# Patient Record
Sex: Female | Born: 1995 | Race: Black or African American | Hispanic: No | Marital: Single | Smoking: Never smoker
Health system: Southern US, Community
[De-identification: ages and names within clinical notes are randomized; demographics above are authoritative.]

## PROBLEM LIST (undated history)

## (undated) DIAGNOSIS — E282 Polycystic ovarian syndrome: Secondary | ICD-10-CM

## (undated) HISTORY — PX: WISDOM TOOTH EXTRACTION: SHX21

## (undated) HISTORY — DX: Polycystic ovarian syndrome: E28.2

## (undated) HISTORY — PX: CHOLECYSTECTOMY: SHX55

## (undated) HISTORY — PX: OTHER SURGICAL HISTORY: SHX169

---
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
## 2022-10-02 ENCOUNTER — Other Ambulatory Visit: Payer: Self-pay

## 2022-10-02 ENCOUNTER — Encounter (HOSPITAL_BASED_OUTPATIENT_CLINIC_OR_DEPARTMENT_OTHER): Payer: Self-pay

## 2022-10-02 DIAGNOSIS — Z20822 Contact with and (suspected) exposure to covid-19: Secondary | ICD-10-CM | POA: Diagnosis not present

## 2022-10-02 DIAGNOSIS — J069 Acute upper respiratory infection, unspecified: Secondary | ICD-10-CM | POA: Insufficient documentation

## 2022-10-02 DIAGNOSIS — R0981 Nasal congestion: Secondary | ICD-10-CM | POA: Diagnosis present

## 2022-10-02 LAB — RESP PANEL BY RT-PCR (FLU A&B, COVID) ARPGX2
Influenza A by PCR: NEGATIVE
Influenza B by PCR: NEGATIVE
SARS Coronavirus 2 by RT PCR: NEGATIVE

## 2022-10-02 NOTE — ED Triage Notes (Deleted)
Pt ambulatory to triage with c/o sinus congestion. Pt recently seen at urgent care and told she has an URI and was prescribed Tessalon Perles. Pt concerned because when she blew her nose she had a large pink tinged snot and one large red tinged snot.

## 2022-10-02 NOTE — ED Triage Notes (Signed)
Pt ambulatory to triage with c/o sinus congestion. Pt recently seen at urgent care and told she has an URI and was prescribed Tessalon Perles. Pt concerned because when she blew her nose she had a large pink tinged snot and one large red tinged snot. 

## 2022-10-03 ENCOUNTER — Emergency Department (HOSPITAL_BASED_OUTPATIENT_CLINIC_OR_DEPARTMENT_OTHER)
Admission: EM | Admit: 2022-10-03 | Discharge: 2022-10-03 | Disposition: A | Discharge status: Home / Self Care | Payer: BC Managed Care – PPO | Attending: Emergency Medicine | Admitting: Emergency Medicine

## 2022-10-03 DIAGNOSIS — J069 Acute upper respiratory infection, unspecified: Secondary | ICD-10-CM

## 2022-10-03 NOTE — Discharge Instructions (Signed)
When you blow your nose, use multiple light blows rather than 1 hard blow.  You may use any of the over-the-counter cough and cold medications as needed, but there not very effective.  Drink plenty of fluids.  Take acetaminophen and/or ibuprofen as needed for fever or aching.

## 2022-10-03 NOTE — ED Provider Notes (Signed)
[facility] EMERGENCY DEPARTMENT Provider Note   CSN: [id number] Arrival date & time: 10/02/22  2211     History  Chief Complaint  Patient presents with   Nasal Congestion    Julie Alvarez is a 26 y.o. female.  The history is provided by the patient.  She started getting sick 5 days ago with nasal congestion, sore throat, slight cough which is nonproductive.  She went to an urgent care center 2 days ago where she was diagnosed with a viral URI and given prescription for benzonatate.  She states that benzonatate has not helped her cough.  However, she has noted that her nasal mucus has been blood-streaked.  She has run low-grade fevers but no chills.  She has had sweats, but thinks that this may have been related to her menses.  She denies any sick contacts.  Her main concern is the blood in her nasal mucus.   Home Medications Prior to Admission medications   Medication Sig Start Date End Date Taking? Authorizing Provider  benzonatate (TESSALON) 200 MG capsule Take by mouth. 10/01/22 10/08/22 Yes [provider]  Norethindrone Acetate-Ethinyl Estrad-FE (BLISOVI 24 FE) 1-20 MG-MCG(24) tablet Take 1 tablet by mouth daily.   Yes [provider]      Allergies    Patient has no known allergies.    Review of Systems   Review of Systems  All other systems reviewed and are negative.   Physical Exam Updated Vital Signs BP (!) 138/90 (BP Location: Right Arm)   Pulse 86   Temp 98.8 F (37.1 C) (Oral)   Resp 14   Ht 4\' 11"  (1.499 m)   Wt 79.4 kg   LMP 09/05/2022 (Approximate)   SpO2 99%   BMI 35.35 kg/m  Physical Exam Vitals and nursing note reviewed.  26 year old female, resting comfortably and in no acute distress. Vital signs are normal. Oxygen saturation is 99%, which is normal. Head is normocephalic and atraumatic. PERRLA, EOMI. Oropharynx is clear.  There is no sinus tenderness.  Nasal turbinates are mildly edematous, right greater  than left.  No bleeding site is identified. Neck is nontender and supple without adenopathy or JVD. Back is nontender and there is no CVA tenderness. Lungs are clear without rales, wheezes, or rhonchi. Chest is nontender. Heart has regular rate and rhythm without murmur. Abdomen is soft, flat, nontender. Extremities have no cyanosis or edema, full range of motion is present. Skin is warm and dry without rash. Neurologic: Mental status is normal, cranial nerves are intact, moves all extremities equally.  ED Results / Procedures / Treatments   Labs (all labs ordered are listed, but only abnormal results are displayed) Labs Reviewed  RESP PANEL BY RT-PCR (FLU A&B, COVID) ARPGX2   Procedures Procedures    Medications Ordered in ED Medications - No data to display  ED Course/ Medical Decision Making/ A&P                           Medical Decision Making  Respiratory tract infection which is most likely viral.  Consider influenza, COVID-19, other viral respiratory infections.  No evidence of significant nosebleed, I suspect this is from trauma from blowing her nose very hard.  I have reviewed and interpreted her laboratory test, and my interpretation is no evidence of COVID-19 or influenza.  Patient is advised of this finding.  I have reviewed her old records, and she was seen at  urgent care on 10/01/2022 with diagnosis of viral URI with cough.  I explained to the patient that there is no indication for antibiotics at this time, but she should be reevaluated if the nature of her symptoms seems to change.  I recommended she continue taking acetaminophen and ibuprofen as needed for fever or aching, use over-the-counter cough and cold medications as needed.  Final Clinical Impression(s) / ED Diagnoses Final diagnoses:  Viral URI with cough    Rx / DC Orders ED Discharge Orders     None         Delora Fuel, MD 79/89/21 [phone]

## 2023-01-07 ENCOUNTER — Encounter (HOSPITAL_BASED_OUTPATIENT_CLINIC_OR_DEPARTMENT_OTHER): Payer: Self-pay

## 2023-01-07 ENCOUNTER — Emergency Department (HOSPITAL_BASED_OUTPATIENT_CLINIC_OR_DEPARTMENT_OTHER)
Admission: EM | Admit: 2023-01-07 | Discharge: 2023-01-08 | Disposition: A | Discharge status: Home / Self Care | Payer: BC Managed Care – PPO | Attending: Emergency Medicine | Admitting: Emergency Medicine

## 2023-01-07 ENCOUNTER — Other Ambulatory Visit: Payer: Self-pay

## 2023-01-07 DIAGNOSIS — Z1152 Encounter for screening for COVID-19: Secondary | ICD-10-CM | POA: Diagnosis not present

## 2023-01-07 DIAGNOSIS — R42 Dizziness and giddiness: Secondary | ICD-10-CM | POA: Insufficient documentation

## 2023-01-07 DIAGNOSIS — R300 Dysuria: Secondary | ICD-10-CM | POA: Insufficient documentation

## 2023-01-07 DIAGNOSIS — R55 Syncope and collapse: Secondary | ICD-10-CM | POA: Diagnosis not present

## 2023-01-07 DIAGNOSIS — R350 Frequency of micturition: Secondary | ICD-10-CM | POA: Diagnosis not present

## 2023-01-07 DIAGNOSIS — R944 Abnormal results of kidney function studies: Secondary | ICD-10-CM | POA: Insufficient documentation

## 2023-01-07 DIAGNOSIS — N3000 Acute cystitis without hematuria: Secondary | ICD-10-CM

## 2023-01-07 LAB — BASIC METABOLIC PANEL
Anion gap: 9 (ref 5–15)
BUN: 12 mg/dL (ref 6–20)
CO2: 23 mmol/L (ref 22–32)
Calcium: 8.9 mg/dL (ref 8.9–10.3)
Chloride: 103 mmol/L (ref 98–111)
Creatinine, Ser: 1.05 mg/dL — ABNORMAL HIGH (ref 0.44–1.00)
GFR, Estimated: >60 mL/min (ref >60)
Glucose, Bld: 115 mg/dL — ABNORMAL HIGH (ref 70–99)
Potassium: 3.9 mmol/L (ref 3.5–5.1)
Sodium: 135 mmol/L (ref 135–145)

## 2023-01-07 LAB — CBG MONITORING, ED: Glucose-Capillary: 100 mg/dL — ABNORMAL HIGH (ref 70–99)

## 2023-01-07 LAB — URINALYSIS, ROUTINE W REFLEX MICROSCOPIC
Bilirubin Urine: NEGATIVE
Glucose, UA: NEGATIVE mg/dL
Ketones, ur: NEGATIVE mg/dL
Nitrite: NEGATIVE
Protein, ur: NEGATIVE mg/dL
Specific Gravity, Urine: 1.025 (ref 1.005–1.030)
pH: 5 (ref 5.0–8.0)

## 2023-01-07 LAB — URINALYSIS, MICROSCOPIC (REFLEX)

## 2023-01-07 LAB — RESP PANEL BY RT-PCR (RSV, FLU A&B, COVID)  RVPGX2
Influenza A by PCR: NEGATIVE
Influenza B by PCR: NEGATIVE
Resp Syncytial Virus by PCR: NEGATIVE
SARS Coronavirus 2 by RT PCR: NEGATIVE

## 2023-01-07 LAB — PREGNANCY, URINE: Preg Test, Ur: NEGATIVE

## 2023-01-07 MED ORDER — IBUPROFEN 800 MG PO TABS
800.0000 mg | ORAL_TABLET | Freq: Once | ORAL | Status: AC
  Administered 2023-01-08: 800 mg via ORAL
  Filled 2023-01-07: qty 1

## 2023-01-07 MED ORDER — CEPHALEXIN 250 MG PO CAPS
500.0000 mg | ORAL_CAPSULE | Freq: Once | ORAL | Status: AC
  Administered 2023-01-08: 500 mg via ORAL
  Filled 2023-01-07: qty 2

## 2023-01-07 NOTE — ED Triage Notes (Signed)
Pt endorses dizziness that began today. Mother reports she has been having HA's and pt endorses subjective fevers. States she has felt warm. Denies N/V/D.

## 2023-01-07 NOTE — ED Provider Notes (Incomplete)
[facility] Provider Note   CSN: [id number] Arrival date & time: 01/07/23  2157     History {Add pertinent medical, surgical, social history, OB history to HPI:1} Chief Complaint  Patient presents with   Dizziness   Headache    Julie Alvarez is a 27 y.o. female.   Dizziness Quality:  Lightheadedness Severity:  Moderate Onset quality:  Gradual Duration:  1 day Progression:  Waxing and waning Chronicity:  New Relieved by:  Nothing Worsened by:  Nothing Ineffective treatments:  None tried Associated symptoms: no tinnitus   Risk factors: no hx of vertigo   Patient with one day of lightheadedness and had a headache yesterday.  Also has had urinary frequency and dysuria.  No rashes on the skin.  No neck pain or stiffness.       Home Medications Prior to Admission medications   Medication Sig Start Date End Date Taking? Authorizing Provider  Norethindrone Acetate-Ethinyl Estrad-FE (BLISOVI 24 FE) 1-20 MG-MCG(24) tablet Take 1 tablet by mouth daily.    [provider]      Allergies    Patient has no known allergies.    Review of Systems   Review of Systems  HENT:  Negative for tinnitus.     Physical Exam Updated Vital Signs BP (!) 136/93 (BP Location: Right Arm)   Pulse 92   Temp 98.7 F (37.1 C) (Oral)   Resp 18   Ht 4\' 11"  (1.499 m)   Wt 81.6 kg   SpO2 100%   BMI 36.36 kg/m  Physical Exam  ED Results / Procedures / Treatments   Labs (all labs ordered are listed, but only abnormal results are displayed) Labs Reviewed  BASIC METABOLIC PANEL - Abnormal; Notable for the following components:      Result Value   Glucose, Bld 115 (*)    Creatinine, Ser 1.05 (*)    All other components within normal limits  URINALYSIS, ROUTINE W REFLEX MICROSCOPIC - Abnormal; Notable for the following components:   APPearance HAZY (*)    Hgb urine dipstick SMALL (*)    Leukocytes,Ua SMALL (*)    All other  components within normal limits  URINALYSIS, MICROSCOPIC (REFLEX) - Abnormal; Notable for the following components:   Bacteria, UA MANY (*)    All other components within normal limits  CBG MONITORING, ED - Abnormal; Notable for the following components:   Glucose-Capillary 100 (*)    All other components within normal limits  RESP PANEL BY RT-PCR (RSV, FLU A&B, COVID)  RVPGX2  PREGNANCY, URINE  CBC    EKG None  Radiology No results found.  Procedures Procedures  {Document cardiac monitor, telemetry assessment procedure when appropriate:1}  Medications Ordered in ED Medications  ibuprofen (ADVIL) tablet 800 mg (has no administration in time range)  cephALEXin (KEFLEX) capsule 500 mg (has no administration in time range)    ED Course/ Medical Decision Making/ A&P   {   Click here for ABCD2, HEART and other calculatorsREFRESH Note before signing :1}                          Medical Decision Making Amount and/or Complexity of Data Reviewed Labs: ordered.  Risk Prescription drug management.   ***  {Document critical care time when appropriate:1} {Document review of labs and clinical decision tools ie heart score, Chads2Vasc2 etc:1}  {Document your independent review of radiology images, and any outside records:1} {  Document your discussion with family members, caretakers, and with consultants:1} {Document social determinants of health affecting pt's care:1} {Document your decision making why or why not admission, treatments were needed:1} Final Clinical Impression(s) / ED Diagnoses Final diagnoses:  None    Rx / DC Orders ED Discharge Orders     None

## 2023-01-08 MED ORDER — CEPHALEXIN 500 MG PO CAPS: 500.0000 mg | ORAL_CAPSULE | Freq: Four times a day (QID) | ORAL | 0 refills | Status: DC

## 2023-01-08 NOTE — ED Notes (Addendum)
Pt reports HA and dizziness and fevers on/off. Labs have been sent Mother at bedside Cranberry juice given to pt

## 2024-03-07 ENCOUNTER — Emergency Department (HOSPITAL_BASED_OUTPATIENT_CLINIC_OR_DEPARTMENT_OTHER)
Admission: EM | Admit: 2024-03-07 | Discharge: 2024-03-07 | Disposition: A | Discharge status: Home / Self Care | Attending: Emergency Medicine | Admitting: Emergency Medicine

## 2024-03-07 ENCOUNTER — Encounter (HOSPITAL_BASED_OUTPATIENT_CLINIC_OR_DEPARTMENT_OTHER): Payer: Self-pay | Admitting: Emergency Medicine

## 2024-03-07 ENCOUNTER — Other Ambulatory Visit: Payer: Self-pay

## 2024-03-07 DIAGNOSIS — R21 Rash and other nonspecific skin eruption: Secondary | ICD-10-CM | POA: Insufficient documentation

## 2024-03-07 MED ORDER — DESONIDE 0.05 % EX CREA: TOPICAL_CREAM | Freq: Two times a day (BID) | CUTANEOUS | 0 refills | Status: AC

## 2024-03-07 MED ORDER — DEXAMETHASONE SODIUM PHOSPHATE 10 MG/ML IJ SOLN
10.0000 mg | Freq: Once | INTRAMUSCULAR | Status: AC
  Administered 2024-03-07: 10 mg via INTRAMUSCULAR
  Filled 2024-03-07: qty 1

## 2024-03-07 NOTE — ED Provider Notes (Signed)
 [facility] Provider Note   CSN: [id number] Arrival date & time: 03/07/24  1941     History  Chief Complaint  Patient presents with   Rash    Julie Alvarez is a 28 y.o. female.  Patient is a 28 year old who presents with a rash to her face.  She said it started about a week ago.  It is very itchy.  She has had some lotion but no other treatment.  She has not used Benadryl.  She does have a history of psoriasis and eczema.  She previously has seen a dermatologist but not recently.  She does not have any prescription creams to use.  She denies any new exposures or lotions to her face.  Denies any shortness of breath.  No oral involvement.       Home Medications Prior to Admission medications   Medication Sig Start Date End Date Taking? Authorizing Provider  desonide (DESOWEN) 0.05 % cream Apply topically 2 (two) times daily. 03/07/24  Yes Rolan Bucco, MD  cephALEXin (KEFLEX) 500 MG capsule Take 1 capsule (500 mg total) by mouth 4 (four) times daily. 01/08/23   Palumbo, April, MD  Norethindrone Acetate-Ethinyl Estrad-FE (BLISOVI 24 FE) 1-20 MG-MCG(24) tablet Take 1 tablet by mouth daily.    [provider]      Allergies    Patient has no known allergies.    Review of Systems   Review of Systems  Constitutional:  Negative for chills, diaphoresis, fatigue and fever.  HENT:  Negative for congestion, rhinorrhea and sneezing.   Eyes: Negative.   Respiratory:  Negative for cough, chest tightness and shortness of breath.   Cardiovascular:  Negative for chest pain and leg swelling.  Gastrointestinal:  Negative for abdominal pain, blood in stool, diarrhea, nausea and vomiting.  Genitourinary:  Negative for difficulty urinating, flank pain, frequency and hematuria.  Musculoskeletal:  Negative for arthralgias and back pain.  Skin:  Positive for rash.  Neurological:  Negative for dizziness, speech difficulty, weakness,  numbness and headaches.    Physical Exam Updated Vital Signs BP 117/81 (BP Location: Right Arm)   Pulse 87   Temp 98.4 F (36.9 C) (Oral)   Resp 15   Ht 4\' 11"  (1.499 m)   Wt 81.6 kg   SpO2 100%   BMI 36.36 kg/m  Physical Exam Constitutional:      Appearance: She is well-developed.  HENT:     Head: Normocephalic and atraumatic.     Mouth/Throat:     Comments: No angioedema Eyes:     Pupils: Pupils are equal, round, and reactive to light.  Cardiovascular:     Rate and Rhythm: Normal rate and regular rhythm.     Heart sounds: Normal heart sounds.  Pulmonary:     Effort: Pulmonary effort is normal. No respiratory distress.     Breath sounds: Normal breath sounds. No wheezing or rales.  Chest:     Chest wall: No tenderness.  Abdominal:     General: Bowel sounds are normal.     Palpations: Abdomen is soft.     Tenderness: There is no abdominal tenderness. There is no guarding or rebound.  Musculoskeletal:        General: Normal range of motion.     Cervical back: Normal range of motion and neck supple.  Lymphadenopathy:     Cervical: No cervical adenopathy.  Skin:    General: Skin is warm and dry.  Findings: No rash.     Comments: Maculopapule rash to the face with some dry skin in areas.  No petechiae or purpura.  No vesicles.  Neurological:     Mental Status: She is alert and oriented to person, place, and time.     ED Results / Procedures / Treatments   Labs (all labs ordered are listed, but only abnormal results are displayed) Labs Reviewed - No data to display  EKG None  Radiology No results found.  Procedures Procedures    Medications Ordered in ED Medications  dexamethasone (DECADRON) injection 10 mg (has no administration in time range)    ED Course/ Medical Decision Making/ A&P                                 Medical Decision Making Risk Prescription drug management.   Patient is a 28 year old who presents with a facial rash.  It  looks to be likely an eczema type rash.  I do not see any angioedema.  No shortness of breath or other systemic symptoms.  No rash in other areas.  No signs of infection.  She was discharged home in good condition.  She was given a dose of Decadron in the ED.  Was advised that she can use Benadryl for itching.  Will give her a prescription for desonide cream to use and also advised her to use Aquaphor to the face.  Will plan on following up with her dermatologist.  Return precautions were given.  Final Clinical Impression(s) / ED Diagnoses Final diagnoses:  Rash    Rx / DC Orders ED Discharge Orders          Ordered    desonide (DESOWEN) 0.05 % cream  2 times daily        03/07/24 2305              Rolan Bucco, MD 03/07/24 2308

## 2024-03-07 NOTE — Discharge Instructions (Signed)
 Use the steroid cream.  Also use Aquaphor to the face for hydration.  You can use Benadryl as needed for itching.  Make an appointment to follow-up with your dermatologist.  Return to the emergency room if you have any worsening symptoms.

## 2024-03-07 NOTE — ED Triage Notes (Signed)
 Pt with facial rash that started this past week

## 2024-03-18 ENCOUNTER — Encounter: Payer: Self-pay | Admitting: Obstetrics and Gynecology

## 2024-03-18 ENCOUNTER — Ambulatory Visit (INDEPENDENT_AMBULATORY_CARE_PROVIDER_SITE_OTHER): Payer: BC Managed Care – PPO | Admitting: Obstetrics and Gynecology

## 2024-03-18 ENCOUNTER — Other Ambulatory Visit (HOSPITAL_COMMUNITY)
Admission: RE | Admit: 2024-03-18 | Discharge: 2024-03-18 | Disposition: A | Discharge status: Home / Self Care | Source: Ambulatory Visit | Attending: Obstetrics and Gynecology | Admitting: Obstetrics and Gynecology

## 2024-03-18 VITALS — BP 114/80 | HR 86 | Ht 59.0 in | Wt 178.0 lb

## 2024-03-18 DIAGNOSIS — Z113 Encounter for screening for infections with a predominantly sexual mode of transmission: Secondary | ICD-10-CM

## 2024-03-18 DIAGNOSIS — Z01419 Encounter for gynecological examination (general) (routine) without abnormal findings: Secondary | ICD-10-CM

## 2024-03-18 DIAGNOSIS — E282 Polycystic ovarian syndrome: Secondary | ICD-10-CM

## 2024-03-18 MED ORDER — MEDROXYPROGESTERONE ACETATE 10 MG PO TABS: 10.0000 mg | ORAL_TABLET | Freq: Every day | ORAL | 12 refills | Status: AC

## 2024-03-18 NOTE — Progress Notes (Signed)
 28 y.o. y.o. female here for New to establish annual exam. Patient's last menstrual period was 03/12/2024 (exact date). Period Pattern: (!) Irregular Menstrual Flow: Light Menstrual Control: Maxi pad Dysmenorrhea: None  Stopped ocp's. Period stopped for 8 months and started now Has unpredictable periods, hirsutism, acne. [facility] with last [place] Gardesil: reports she has completed the vaccination Pap: denies abnormal pap smears. Last one 2 years ago  Body mass index is 35.95 kg/m.      No data to display          Blood pressure 114/80, pulse 86, height 4\' 11"  (1.499 m), weight 178 lb (80.7 kg), last menstrual period 03/12/2024, SpO2 99%.  No results found for: "DIAGPAP", "HPVHIGH", "ADEQPAP"  GYN HISTORY: No results found for: "DIAGPAP", "HPVHIGH", "ADEQPAP"  OB History  Gravida Para Term Preterm AB Living  0 0 0 0 0 0  SAB IAB Ectopic Multiple Live Births  0 0 0 0 0    No past medical history on file.  Past Surgical History:  Procedure Laterality Date   CHOLECYSTECTOMY     WISDOM TOOTH EXTRACTION Bilateral     Current Outpatient Medications on File Prior to Visit  Medication Sig Dispense Refill   Ascorbic Acid (VITAMIN C PO) Take by mouth.     CHLOROPHYLL PO Take by mouth. drops     desonide (DESOWEN) 0.05 % cream Apply topically 2 (two) times daily. 30 g 0   loratadine (CLARITIN) 10 MG tablet Take 10 mg by mouth daily.     No current facility-administered medications on file prior to visit.    Social History   Socioeconomic History   Marital status: Single    Spouse name: Not on file   Number of children: Not on file   Years of education: Not on file   Highest education level: Not on file  Occupational History   Not on file  Tobacco Use   Smoking status: Never   Smokeless tobacco: Never  Vaping Use   Vaping status: Never Used  Substance and Sexual Activity   Alcohol use: Yes    Comment: occasionally   Drug use: Never   Sexual  activity: Not Currently    Partners: Male    Birth control/protection: Abstinence  Other Topics Concern   Not on file  Social History Narrative   Not on file   Social Drivers of Health   Financial Resource Strain: Medium Risk (11/29/2023)   Received from [facility]   Overall Financial Resource Strain (CARDIA)    Difficulty of Paying Living Expenses: Somewhat hard  Food Insecurity: Patient Declined (11/29/2023)   Received from [facility]   Hunger Vital Sign    Worried About Running Out of Food in the Last Year: Patient declined    Ran Out of Food in the Last Year: Patient declined  Transportation Needs: No Transportation Needs (11/29/2023)   Received from [facility] - Transportation    Lack of Transportation (Medical): No    Lack of Transportation (Non-Medical): No  Physical Activity: Unknown (11/29/2023)   Received from [facility]   Exercise Vital Sign    Days of Exercise per Week: 0 days    Minutes of Exercise per Session: Not on file  Stress: Stress Concern Present (11/29/2023)   Received from [facility] - Occupational Stress Questionnaire    Feeling of Stress : To some extent  Social Connections: Somewhat Isolated (11/29/2023)  Received from [facility]   Social Network    How would you rate your social network (family, work, friends)?: Restricted participation with some degree of social isolation  Intimate Partner Violence: Patient Declined (11/29/2023)   Received from [facility]   HITS    Over the last 12 months how often did your partner physically hurt you?: Patient declined    Over the last 12 months how often did your partner insult you or talk down to you?: Patient declined    Over the last 12 months how often did your partner threaten you with physical harm?: Patient declined    Over the last 12 months how often did your partner scream or curse at you?: Patient declined    Family History   Problem Relation Age of Onset   Hypertension Mother    Diabetes Mother    Hypertension Father    Hypertension Maternal Grandmother    Diabetes Maternal Grandmother    Heart attack Maternal Grandmother    Stroke Maternal Grandfather    Hypertension Maternal Grandfather    Diabetes Maternal Grandfather    Stroke Paternal Grandmother    Hypertension Paternal Grandmother    Hypertension Paternal Grandfather      No Known Allergies    Patient's last menstrual period was Patient's last menstrual period was 03/12/2024 (exact date)..             Review of Systems Alls systems reviewed and are negative.     Physical Exam Constitutional:      Appearance: Normal appearance.  Genitourinary:     Vulva and urethral meatus normal.     No lesions in the vagina.     Right Labia: No rash, lesions or skin changes.    Left Labia: No lesions, skin changes or rash.    No vaginal discharge or tenderness.     No vaginal prolapse present.    No vaginal atrophy present.     Right Adnexa: not tender, not palpable and no mass present.    Left Adnexa: not tender, not palpable and no mass present.    No cervical motion tenderness or discharge.     Uterus is not enlarged, tender or irregular.  Breasts:    Right: Normal.     Left: Normal.  HENT:     Head: Normocephalic.  Neck:     Thyroid: No thyroid mass, thyromegaly or thyroid tenderness.  Cardiovascular:     Rate and Rhythm: Normal rate and regular rhythm.     Heart sounds: Normal heart sounds, S1 normal and S2 normal.  Pulmonary:     Effort: Pulmonary effort is normal.     Breath sounds: Normal breath sounds and air entry.  Abdominal:     General: There is no distension.     Palpations: Abdomen is soft. There is no mass.     Tenderness: There is no abdominal tenderness. There is no guarding or rebound.  Musculoskeletal:        General: Normal range of motion.     Cervical back: Full passive range of motion without pain, normal  range of motion and neck supple. No tenderness.     Right lower leg: No edema.     Left lower leg: No edema.  Neurological:     Mental Status: She is alert.  Skin:    General: Skin is warm.  Psychiatric:        Mood and Affect: Mood normal.  Behavior: Behavior normal.        Thought Content: Thought content normal.  Vitals and nursing note reviewed. Exam conducted with a chaperone present.       A:         Well Woman GYN exam                             P:        Pap smear collected today STI testing completed today Offered PUS to evaluate ovaries. Patient with 2 of 3 [place] Criteria for diagnosis for PCO. Counseled on the importance of monitoring simple sugars, limiting carbohydrate intake to 45 grams a day, avoiding processed foods and getting more protein and fiber in your diet.  The Mediterranean diet provides a great balance. Look for PCO specific cook books to buy.  Try to get 20 min of exercise in 5 days a week. Inositol is a natural supplement that can be taken to help improve cycle regularity and hormone imbalance in PCO.  This can be found at supplement stores, Building control surveyor. You will have a higher risk for endometrial cancer and diabetes in the future.  Counseled on the mirena IUD to help control the bleeding and protect the lining from endometrial cancer in the future.  The condition is associated with  periods of anovulation, however, discussed having the diagnosis does not mean you will not need birth control in the future. When not trying to conceive, birth control is important to help stabilize the endometrial lining and prevent endometrial cancer. Improved diet and exercise lowers the insulin resistance and usually improve ovulation rates as well.  Some cases still require ovulation medication in the future.  Provera sent to induce a period monthly, if it does not come. Instructed to take a pregnancy test each month before starting, if sexually active.   Information provided on PCO and diet. Labs: see orders today Encouraged healthy lifestyle practices  No follow-ups on file.  Earley Favor

## 2024-03-18 NOTE — Patient Instructions (Addendum)
 PCO  Monitoring simple sugars, eating out, sugar drinks, limiting carbohydrate intake to 45 grams a day, avoiding processed foods and getting more protein and fiber in your diet.  The Mediterranean diet provides a great balance. Look for PCO specific cook books to buy.  Try to get 20 min of exercise in 5 days a week. Inositol is a natural supplement that can be taken to help improve cycle regularity and hormone imbalance in PCO.  This can be found at supplement stores, Building control surveyor. You will have a higher risk for endometrial cancer and diabetes in the future.  Counseled on the mirena IUD to help control the bleeding and protect the lining from endometrial cancer in the future.  The condition is associated with  periods of anovulation, however, discussed having the diagnosis does not mean you will not need birth control in the future. When not trying to conceive, birth control is important to help stabilize the endometrial lining and prevent endometrial cancer. Improved diet and exercise lowers the insulin resistance and usually improve ovulation rates as well.  Some cases still require ovulation medication in the future.     I also put a referral in to nutritionist to help develop a plan with the diet.

## 2024-03-19 ENCOUNTER — Encounter: Payer: Self-pay | Admitting: Obstetrics and Gynecology

## 2024-03-19 LAB — RPR: RPR Ser Ql: NONREACTIVE

## 2024-03-19 LAB — CBC
HCT: 41 % (ref 35.0–45.0)
Hemoglobin: 13.1 g/dL (ref 11.7–15.5)
MCH: 26.7 pg — ABNORMAL LOW (ref 27.0–33.0)
MCHC: 32 g/dL (ref 32.0–36.0)
MCV: 83.7 fL (ref 80.0–100.0)
MPV: 10.3 fL (ref 7.5–12.5)
Platelets: 365 10*3/uL (ref 140–400)
RBC: 4.9 10*6/uL (ref 3.80–5.10)
RDW: 12.2 % (ref 11.0–15.0)
WBC: 7.8 10*3/uL (ref 3.8–10.8)

## 2024-03-19 LAB — COMPREHENSIVE METABOLIC PANEL WITH GFR
AG Ratio: 1.6 (calc) (ref 1.0–2.5)
ALT: 13 U/L (ref 6–29)
AST: 15 U/L (ref 10–30)
Albumin: 4.4 g/dL (ref 3.6–5.1)
Alkaline phosphatase (APISO): 96 U/L (ref 31–125)
BUN: 12 mg/dL (ref 7–25)
CO2: 26 mmol/L (ref 20–32)
Calcium: 9.3 mg/dL (ref 8.6–10.2)
Chloride: 104 mmol/L (ref 98–110)
Creat: 0.83 mg/dL (ref 0.50–0.96)
Globulin: 2.7 g/dL (ref 1.9–3.7)
Glucose, Bld: 76 mg/dL (ref 65–99)
Potassium: 4.2 mmol/L (ref 3.5–5.3)
Sodium: 137 mmol/L (ref 135–146)
Total Bilirubin: 0.4 mg/dL (ref 0.2–1.2)
Total Protein: 7.1 g/dL (ref 6.1–8.1)
eGFR: 100 mL/min/{1.73_m2} (ref >=60)

## 2024-03-19 LAB — LIPID PANEL
Cholesterol: 141 mg/dL (ref <200)
HDL: 49 mg/dL — ABNORMAL LOW (ref >=50)
LDL Cholesterol (Calc): 75 mg/dL
Non-HDL Cholesterol (Calc): 92 mg/dL (ref <130)
Total CHOL/HDL Ratio: 2.9 (calc) (ref <5.0)
Triglycerides: 86 mg/dL (ref <150)

## 2024-03-19 LAB — HEMOGLOBIN A1C
Hgb A1c MFr Bld: 5.5 %{Hb} (ref <5.7)
Mean Plasma Glucose: 111 mg/dL
eAG (mmol/L): 6.2 mmol/L

## 2024-03-19 LAB — SURESWAB® ADVANCED VAGINITIS PLUS,TMA
C. trachomatis RNA, TMA: NOT DETECTED
CANDIDA SPECIES: NOT DETECTED
Candida glabrata: NOT DETECTED
N. gonorrhoeae RNA, TMA: NOT DETECTED
SURESWAB(R) ADV BACTERIAL VAGINOSIS(BV),TMA: NEGATIVE
TRICHOMONAS VAGINALIS (TV),TMA: NOT DETECTED

## 2024-03-19 LAB — DHEA-SULFATE: DHEA-SO4: 217 ug/dL (ref 14–349)

## 2024-03-19 LAB — HIV ANTIBODY (ROUTINE TESTING W REFLEX): HIV 1&2 Ab, 4th Generation: NONREACTIVE

## 2024-03-19 LAB — HEPATITIS C ANTIBODY: Hepatitis C Ab: NONREACTIVE

## 2024-03-19 LAB — VITAMIN D 25 HYDROXY (VIT D DEFICIENCY, FRACTURES): Vit D, 25-Hydroxy: 20 ng/mL — ABNORMAL LOW (ref 30–100)

## 2024-03-19 LAB — PROLACTIN: Prolactin: 6.2 ng/mL

## 2024-03-19 LAB — TSH: TSH: 1.9 m[IU]/L

## 2024-03-19 LAB — HEPATITIS B SURFACE ANTIGEN: Hepatitis B Surface Ag: NONREACTIVE

## 2024-03-20 LAB — CYTOLOGY - PAP
Adequacy: ABSENT
Comment: NEGATIVE
Diagnosis: NEGATIVE
High risk HPV: NEGATIVE

## 2024-03-21 LAB — ANTI-MULLERIAN HORMONE (AMH), FEMALE: Anti-Mullerian Hormones(AMH), Female: 8.31 ng/mL (ref 0.69–13.39)

## 2024-03-23 LAB — TESTOS,TOTAL,FREE AND SHBG (FEMALE)
Free Testosterone: 3.9 pg/mL (ref 0.1–6.4)
Sex Hormone Binding: 25.1 nmol/L (ref 17–124)
Testosterone, Total, LC-MS-MS: 26 ng/dL (ref 2–45)

## 2024-04-16 NOTE — Progress Notes (Deleted)
 Medical Nutrition Therapy   Appointment Start time:  ***  Appointment End time:  ***  Primary concerns today: ***  Referral diagnosis: - PCO (polycystic ovaries)  Preferred learning style: *** (auditory, visual, hands on, no preference indicated) Learning readiness: *** (not ready, contemplating, ready, change in progress)   NUTRITION ASSESSMENT    Clinical Medical Hx: *** Medications: *** Labs: *** Notable Signs/Symptoms: ***  Lifestyle & Dietary Hx ***  Estimated daily fluid intake: *** oz Supplements: *** Sleep: *** Stress / self-care: *** Current average weekly physical activity: ***  24-Hr Dietary Recall First Meal: *** Snack: *** Second Meal: *** Snack: *** Third Meal: *** Snack: *** Beverages: ***  Estimated Energy Needs Calories: *** Carbohydrate: ***g Protein: ***g Fat: ***g   NUTRITION DIAGNOSIS  {CHL AMB NUTRITIONAL [phone]}   NUTRITION INTERVENTION  Nutrition education (E-1) on the following topics:  ***  Handouts Provided Include  ***  Learning Style & Readiness for Change Teaching method utilized: Visual & Auditory  Demonstrated degree of understanding via: Teach Back  Barriers to learning/adherence to lifestyle change: ***  Goals Established by Pt ***   MONITORING & EVALUATION Dietary intake, weekly physical activity, and *** in ***.  Next Steps  Patient is to ***.

## 2024-04-22 ENCOUNTER — Ambulatory Visit: Admitting: Dietician

## 2024-04-22 DIAGNOSIS — E282 Polycystic ovarian syndrome: Secondary | ICD-10-CM

## 2024-12-07 ENCOUNTER — Emergency Department (HOSPITAL_BASED_OUTPATIENT_CLINIC_OR_DEPARTMENT_OTHER)
Admission: EM | Admit: 2024-12-07 | Discharge: 2024-12-07 | Disposition: A | Discharge status: Home / Self Care | Attending: Emergency Medicine | Admitting: Emergency Medicine

## 2024-12-07 ENCOUNTER — Other Ambulatory Visit: Payer: Self-pay

## 2024-12-07 ENCOUNTER — Emergency Department (HOSPITAL_BASED_OUTPATIENT_CLINIC_OR_DEPARTMENT_OTHER)

## 2024-12-07 ENCOUNTER — Encounter (HOSPITAL_BASED_OUTPATIENT_CLINIC_OR_DEPARTMENT_OTHER): Payer: Self-pay

## 2024-12-07 DIAGNOSIS — Y92810 Car as the place of occurrence of the external cause: Secondary | ICD-10-CM | POA: Insufficient documentation

## 2024-12-07 DIAGNOSIS — W230XXA Caught, crushed, jammed, or pinched between moving objects, initial encounter: Secondary | ICD-10-CM | POA: Insufficient documentation

## 2024-12-07 DIAGNOSIS — M79644 Pain in right finger(s): Secondary | ICD-10-CM | POA: Diagnosis present

## 2024-12-07 NOTE — Discharge Instructions (Signed)
 You may leave your false nail in place right now as a method to splint a possible underlying nail injury.  I do not see significant signs of significant subungual hematoma or significant nail injury that would require removal of your native nail.  I do not see signs of fracture or tendon injury.  Suspect likely bruising causing pain.  Recommend monitoring for signs of infection, washing the area under running water, elevating it, using Tylenol ibuprofen  for pain.

## 2024-12-07 NOTE — ED Triage Notes (Signed)
 Reports smashing thumb in car door today. Swelling noted to R thumb.

## 2024-12-07 NOTE — ED Provider Notes (Signed)
" °  [facility] Provider Note   CSN: [id number] Arrival date & time: 12/07/24  1418     Patient presents with: Hand Injury   Julie Alvarez is a 28 y.o. female.  {Add pertinent medical, surgical, social history, OB history to HPI:32947} HPI     28 year old female with a history of polycystic ovarian syndrome presents with concern for thumb pain after smashing her thumb in a car door.   Past Medical History:  Diagnosis Date   PCO (polycystic ovaries)      Prior to Admission medications  Medication Sig Start Date End Date Taking? Authorizing Provider  Ascorbic Acid (VITAMIN C PO) Take by mouth.    [provider]  CHLOROPHYLL PO Take by mouth. drops    [provider]  desonide  (DESOWEN ) 0.05 % cream Apply topically 2 (two) times daily. 03/07/24   [name], MD  loratadine (CLARITIN) 10 MG tablet Take 10 mg by mouth daily.    [provider]  medroxyPROGESTERone  (PROVERA ) 10 MG tablet Take 1 tablet (10 mg total) by mouth daily. Take course if negative pregnancy test to induce a period each month, if needed 03/18/24   [name], MD    Allergies: Patient has no known allergies.    Review of Systems  Updated Vital Signs BP (!) 143/94 (BP Location: Left Arm)   Pulse 76   Temp 98.1 F (36.7 C)   Resp 16   LMP 10/13/2024 (Approximate)   SpO2 100%   Physical Exam  (all labs ordered are listed, but only abnormal results are displayed) Labs Reviewed - No data to display  EKG: None  Radiology: DG Hand Complete Right Result Date: 12/07/2024 CLINICAL DATA:  Hand injury.  Thumb swelling. EXAM: RIGHT HAND - COMPLETE 3+ VIEW COMPARISON:  None Available. FINDINGS: There is no evidence of fracture or dislocation. There is no evidence of arthropathy or other focal bone abnormality. Mild soft tissue edema. IMPRESSION: Mild soft tissue edema. No fracture or subluxation. Electronically  Signed   By: Hollering Gasman M.D.   On: 12/07/2024 14:55    {Document cardiac monitor, telemetry assessment procedure when appropriate:32947} Procedures   Medications Ordered in the ED - No data to display    {Click here for ABCD2, HEART and other calculators REFRESH Note before signing:1}                              Medical Decision Making Amount and/or Complexity of Data Reviewed Radiology: ordered.   ***  {Document critical care time when appropriate  Document review of labs and clinical decision tools ie CHADS2VASC2, etc  Document your independent review of radiology images and any outside records  Document your discussion with family members, caretakers and with consultants  Document social determinants of health affecting pt's care  Document your decision making why or why not admission, treatments were needed:32947:::1}   Final diagnoses:  None    ED Discharge Orders     None        "

## 2024-12-07 NOTE — ED Notes (Signed)
 Pt. Reports she closed her R thumb in the car door.

## 2024-12-07 NOTE — ED Notes (Signed)
 Cleaned Pt. Finger
